# Patient Record
Sex: Female | Born: 1995 | Race: White | Hispanic: Yes | Marital: Married | Smoking: Former smoker
Health system: Southern US, Community
[De-identification: ages and names within clinical notes are randomized; demographics above are authoritative.]

## PROBLEM LIST (undated history)

## (undated) DIAGNOSIS — F32A Depression, unspecified: Secondary | ICD-10-CM

## (undated) DIAGNOSIS — N83209 Unspecified ovarian cyst, unspecified side: Secondary | ICD-10-CM

## (undated) DIAGNOSIS — F319 Bipolar disorder, unspecified: Secondary | ICD-10-CM

## (undated) DIAGNOSIS — I10 Essential (primary) hypertension: Secondary | ICD-10-CM

## (undated) DIAGNOSIS — J45909 Unspecified asthma, uncomplicated: Secondary | ICD-10-CM

## (undated) DIAGNOSIS — F419 Anxiety disorder, unspecified: Secondary | ICD-10-CM

## (undated) DIAGNOSIS — F1910 Other psychoactive substance abuse, uncomplicated: Secondary | ICD-10-CM

## (undated) DIAGNOSIS — K219 Gastro-esophageal reflux disease without esophagitis: Secondary | ICD-10-CM

## (undated) DIAGNOSIS — T7840XA Allergy, unspecified, initial encounter: Secondary | ICD-10-CM

## (undated) HISTORY — DX: Gastro-esophageal reflux disease without esophagitis: K21.9

## (undated) HISTORY — DX: Anxiety disorder, unspecified: F41.9

## (undated) HISTORY — DX: Other psychoactive substance abuse, uncomplicated: F19.10

## (undated) HISTORY — PX: TONSILLECTOMY: SUR1361

## (undated) HISTORY — DX: Allergy, unspecified, initial encounter: T78.40XA

## (undated) HISTORY — PX: TUBAL LIGATION: SHX77

## (undated) HISTORY — DX: Bipolar disorder, unspecified: F31.9

## (undated) HISTORY — DX: Unspecified ovarian cyst, unspecified side: N83.209

## (undated) HISTORY — DX: Depression, unspecified: F32.A

---
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
## 2017-01-03 ENCOUNTER — Emergency Department
Admission: EM | Admit: 2017-01-03 | Discharge: 2017-01-04 | Disposition: A | Discharge status: Home / Self Care | Payer: Medicaid Other | Attending: Emergency Medicine | Admitting: Emergency Medicine

## 2017-01-03 ENCOUNTER — Encounter: Payer: Self-pay | Admitting: Emergency Medicine

## 2017-01-03 DIAGNOSIS — F410 Panic disorder [episodic paroxysmal anxiety] without agoraphobia: Secondary | ICD-10-CM | POA: Diagnosis not present

## 2017-01-03 DIAGNOSIS — I10 Essential (primary) hypertension: Secondary | ICD-10-CM | POA: Insufficient documentation

## 2017-01-03 DIAGNOSIS — Z79899 Other long term (current) drug therapy: Secondary | ICD-10-CM | POA: Diagnosis not present

## 2017-01-03 DIAGNOSIS — F419 Anxiety disorder, unspecified: Secondary | ICD-10-CM | POA: Diagnosis present

## 2017-01-03 DIAGNOSIS — J45909 Unspecified asthma, uncomplicated: Secondary | ICD-10-CM | POA: Insufficient documentation

## 2017-01-03 DIAGNOSIS — Z87891 Personal history of nicotine dependence: Secondary | ICD-10-CM | POA: Insufficient documentation

## 2017-01-03 LAB — GLUCOSE, CAPILLARY: Glucose-Capillary: 79 mg/dL (ref 65–99)

## 2017-01-03 NOTE — ED Triage Notes (Signed)
Pt presents to ED 27 c/o anxiety attack while walking back from the fireworks in town; pt states "I didn't have seizures" as stated by family to EMS; per EMS, pt's VS were normal with sinus tachycardia; pt is awake, alert and oriented x4; pt denies any pain except for a headache.

## 2017-01-03 NOTE — ED Provider Notes (Signed)
[facility] Emergency Department Provider Note __   First MD Initiated Contact with Patient 01/03/17 2317     (approximate)  I have reviewed the triage vital signs and the nursing notes.   HISTORY  Chief Complaint Anxiety   HPI Selena Williamson is a 21 y.o. female below list of chronic medical conditions including anxiety presents to the emergency department after "an anxiety attack". Patient states that she was notified by her grandmother that she needed to come home because her infant needed her. Patient states while walking home which she states "[place] best [place] neighborhoods" she was being followed by unknown people which made her very anxious in addition patient states she was short of breath secondary to fact that she has asthma and it's hot outside". On arrival to her grandmother's house grandmother states that the patient was hyperventilating and came through the door on hands and knees. Grandmother states that she attempted to have the patient's lower respirations however she persisted to hyperventilate and then subsequently lost consciousness for approximately 15 minutes. Patient states on awakening she felt "like I was hit with a baseball bat". Patient states that she had a headache. Patient does recall hand feet tingling numbness and face before passing out.   Past Medical History:  Diagnosis Date  . Abdominal pain   . Asthma   . Hypertension     Patient Active Problem List   Diagnosis Date Noted  . Family history of Crohn's disease 01/13/2011  . Personal history of kidney stones 01/13/2011  . Generalized abdominal pain     Past Surgical History:  Procedure Laterality Date  . TONSILLECTOMY      Prior to Admission medications   Medication Sig Start Date End Date Taking? Authorizing Provider  ibuprofen (ADVIL,MOTRIN) 600 MG tablet Take 1 tablet (600 mg total) by mouth every 8 (eight) hours as needed. 08/15/16   Joni Reining,  PA-C  ibuprofen (ADVIL,MOTRIN) 600 MG tablet Take 1 tablet (600 mg total) by mouth every 8 (eight) hours as needed. 08/15/16   Joni Reining, PA-C  lamoTRIgine (LAMICTAL) 100 MG tablet Take 100 mg by mouth daily.  12/30/10   [provider]  loratadine (CLARITIN) 10 MG tablet Take 10 mg by mouth daily.   12/30/10   [provider]  Norgestim-Eth Charlott [place]ler Triphasic (ORTHO TRI-CYCLEN LO) 0.18/0.215/0.25 MG-25 MCG TABS Take by mouth.      [provider]  Norgestim-Eth Estrad Triphasic (ORTHO TRI-CYCLEN, 28,) 0.18/0.215/0.25 MG-35 MCG TABS Take 35 mcg by mouth daily.   12/30/10   [provider]  omeprazole (PRILOSEC) 40 MG capsule Take 40 mg by mouth daily.   12/30/10   [provider]  ondansetron (ZOFRAN ODT) 4 MG disintegrating tablet Take 1 tablet (4 mg total) by mouth every 8 (eight) hours as needed for nausea or vomiting. 11/21/16   Don Perking, [place], MD  sertraline (ZOLOFT) 50 MG tablet Take 50 mg by mouth daily.  12/30/10   [provider]  traMADol (ULTRAM) 50 MG tablet Take 1 tablet (50 mg total) by mouth every 6 (six) hours as needed for moderate pain. 08/15/16   Joni Reining, PA-C    Allergies Ciprofloxacin; Geodon [ziprasidone hcl]; and Morphine and related  Family History  Problem Relation Age of Onset  . Diabetes Sister   . Ulcers Maternal Aunt   . Inflammatory bowel disease Mother   . Urolithiasis Father   . Ulcers Maternal Grandfather  Social History Social History  Substance Use Topics  . Smoking status: Former Games developermoker  . Smokeless tobacco: Never Used  . Alcohol use No    Review of Systems Constitutional: No fever/chills Eyes: No visual changes. ENT: No sore throat. Cardiovascular: Denies chest pain. Respiratory: Denies shortness of breath. Gastrointestinal: No abdominal pain.  No nausea, no vomiting.  No diarrhea.  No constipation. Genitourinary: Negative for dysuria. Musculoskeletal: Negative for neck pain.   Negative for back pain. Integumentary: Negative for rash. Neurological: Negative for headaches, focal weakness or numbness. Psychiatric:Positive for panic attack   ____________________________________________   PHYSICAL EXAM:  VITAL SIGNS: ED Triage Vitals  Enc Vitals Group     BP 01/03/17 2233 111/89     Pulse Rate 01/03/17 2233 (!) 114     Resp 01/03/17 2233 13     Temp 01/03/17 2233 98.8 F (37.1 C)     Temp Source 01/03/17 2233 Oral     SpO2 01/03/17 2233 99 %     Weight 01/03/17 2237 86.2 kg (190 lb)     Height 01/03/17 2237 1.575 m (5\' 2" )     Head Circumference --      Peak Flow --      Pain Score 01/03/17 2232 7     Pain Loc --      Pain Edu? --      Excl. in GC? --     Constitutional: Alert and oriented. Well appearing and in no acute distress. Eyes: Conjunctivae are normal. PERRL. EOMI. Head: Atraumatic. Mouth/Throat: Mucous membranes are moist.  Oropharynx non-erythematous. Neck: No stridor.  Cardiovascular: Normal rate, regular rhythm. Good peripheral circulation. Grossly normal heart sounds. Respiratory: Normal respiratory effort.  No retractions. Lungs CTAB. Gastrointestinal: Soft and nontender. No distention.  Musculoskeletal: No lower extremity tenderness nor edema. No gross deformities of extremities. Neurologic:  Normal speech and language. No gross focal neurologic deficits are appreciated.  Skin:  Skin is warm, dry and intact. No rash noted. Psychiatric: Mood and affect are normal. Speech and behavior are normal.  ____________________________________________   LABS (all labs ordered are listed, but only abnormal results are displayed)  Labs Reviewed  GLUCOSE, CAPILLARY      Procedures   ____________________________________________   INITIAL IMPRESSION / ASSESSMENT AND PLAN / ED COURSE  Pertinent labs & imaging results that were available during my care of the patient were reviewed by me and considered in my medical decision making  (see chart for details).  Patient denies any symptoms of anxiety at this time. No focal neurological deficits such imaging not performed. History of physical exam consistent with panic attack with resultant hyperventilation and syncope.      ____________________________________________  FINAL CLINICAL IMPRESSION(S) / ED DIAGNOSES  Final diagnoses:  Panic attack     MEDICATIONS GIVEN DURING THIS VISIT:  Medications - No data to display   NEW OUTPATIENT MEDICATIONS STARTED DURING THIS VISIT:  New Prescriptions   No medications on file    Modified Medications   No medications on file    Discontinued Medications   No medications on file     Note:  This document was prepared using Dragon voice recognition software and may include unintentional dictation errors.    Darci CurrentBrown, [place], MD 01/04/17 0800

## 2017-01-04 NOTE — ED Notes (Signed)

## 2017-09-07 ENCOUNTER — Emergency Department: Payer: Medicaid Other

## 2017-09-07 ENCOUNTER — Emergency Department
Admission: EM | Admit: 2017-09-07 | Discharge: 2017-09-07 | Disposition: A | Discharge status: Home / Self Care | Payer: Medicaid Other | Attending: Emergency Medicine | Admitting: Emergency Medicine

## 2017-09-07 ENCOUNTER — Other Ambulatory Visit: Payer: Self-pay

## 2017-09-07 ENCOUNTER — Encounter: Payer: Self-pay | Admitting: Emergency Medicine

## 2017-09-07 DIAGNOSIS — Z3A01 Less than 8 weeks gestation of pregnancy: Secondary | ICD-10-CM | POA: Insufficient documentation

## 2017-09-07 DIAGNOSIS — J45909 Unspecified asthma, uncomplicated: Secondary | ICD-10-CM | POA: Insufficient documentation

## 2017-09-07 DIAGNOSIS — I10 Essential (primary) hypertension: Secondary | ICD-10-CM | POA: Insufficient documentation

## 2017-09-07 DIAGNOSIS — Z87891 Personal history of nicotine dependence: Secondary | ICD-10-CM | POA: Insufficient documentation

## 2017-09-07 DIAGNOSIS — O208 Other hemorrhage in early pregnancy: Secondary | ICD-10-CM | POA: Diagnosis not present

## 2017-09-07 DIAGNOSIS — Z79899 Other long term (current) drug therapy: Secondary | ICD-10-CM | POA: Insufficient documentation

## 2017-09-07 DIAGNOSIS — O209 Hemorrhage in early pregnancy, unspecified: Secondary | ICD-10-CM

## 2017-09-07 DIAGNOSIS — R1030 Lower abdominal pain, unspecified: Secondary | ICD-10-CM | POA: Diagnosis present

## 2017-09-07 DIAGNOSIS — R102 Pelvic and perineal pain: Secondary | ICD-10-CM | POA: Insufficient documentation

## 2017-09-07 LAB — URINALYSIS, COMPLETE (UACMP) WITH MICROSCOPIC
BILIRUBIN URINE: NEGATIVE
GLUCOSE, UA: NEGATIVE mg/dL
Hgb urine dipstick: NEGATIVE
KETONES UR: NEGATIVE mg/dL
NITRITE: NEGATIVE
PH: 7 (ref 5.0–8.0)
PROTEIN: NEGATIVE mg/dL
Specific Gravity, Urine: 1.014 (ref 1.005–1.030)

## 2017-09-07 LAB — POCT PREGNANCY, URINE: Preg Test, Ur: POSITIVE — AB

## 2017-09-07 LAB — HCG, QUANTITATIVE, PREGNANCY: hCG, Beta Chain, Quant, S: 1851 m[IU]/mL — ABNORMAL HIGH (ref <5)

## 2017-09-07 MED ORDER — ONDANSETRON 4 MG PO TBDP
4.0000 mg | ORAL_TABLET | Freq: Once | ORAL | Status: AC
  Administered 2017-09-07: 4 mg via ORAL

## 2017-09-07 MED ORDER — ONDANSETRON 4 MG PO TBDP
ORAL_TABLET | ORAL | Status: AC
  Filled 2017-09-07: qty 1

## 2017-09-07 NOTE — ED Notes (Signed)
Triage completed by this RN not RH.

## 2017-09-07 NOTE — ED Provider Notes (Signed)
[facility] Emergency Department Provider Note  ____________________________________________  Time seen: Approximately 10:21 AM  I have reviewed the triage vital signs and the nursing notes.   HISTORY  Chief Complaint Abdominal Pain    HPI Selena Williamson is a 22 y.o. female who was in her usual state of health when she went to sleep last night, and then woke up this morning with abdominal cramping and feeling like contractions in the suprapubic area. She also noted a small amount of maroon-colored vaginal discharge when she was wiping after using the bathroom this morning. Denies any ongoing vaginal bleeding. No radiating pain, no aggravating or alleviating factors, mild to moderate intensity.  She does report that she is 2 months pregnant by dates based on home pregnancy test. She was referred to [facility] for high risk maternal-fetal medicine due to gestational hypertension in the past. She has her first appointment in one week. No ultrasounds so far this pregnancy.     Past Medical History:  Diagnosis Date  . Abdominal pain   . Asthma   . Hypertension      Patient Active Problem List   Diagnosis Date Noted  . Family history of Crohn's disease 01/13/2011  . Personal history of kidney stones 01/13/2011  . Generalized abdominal pain      Past Surgical History:  Procedure Laterality Date  . TONSILLECTOMY       Prior to Admission medications   Medication Sig Start Date End Date Taking? Authorizing Provider  acetaminophen (TYLENOL) 325 MG tablet Take 650 mg by mouth every 6 (six) hours as needed.   Yes [provider]  ibuprofen (ADVIL,MOTRIN) 600 MG tablet Take 1 tablet (600 mg total) by mouth every 8 (eight) hours as needed. 08/15/16   Joni Reining, PA-C  ibuprofen (ADVIL,MOTRIN) 600 MG tablet Take 1 tablet (600 mg total) by mouth every 8 (eight) hours as needed. 08/15/16   Joni Reining, PA-C  lamoTRIgine (LAMICTAL) 100 MG tablet Take  100 mg by mouth daily.  12/30/10   [provider]  loratadine (CLARITIN) 10 MG tablet Take 10 mg by mouth daily.   12/30/10   [provider]  Norgestim-Eth Charlott Holler Triphasic (ORTHO TRI-CYCLEN LO) 0.18/0.215/0.25 MG-25 MCG TABS Take by mouth.      [provider]  Norgestim-Eth Estrad Triphasic (ORTHO TRI-CYCLEN, 28,) 0.18/0.215/0.25 MG-35 MCG TABS Take 35 mcg by mouth daily.   12/30/10   [provider]  omeprazole (PRILOSEC) 40 MG capsule Take 40 mg by mouth daily.   12/30/10   [provider]  ondansetron (ZOFRAN ODT) 4 MG disintegrating tablet Take 1 tablet (4 mg total) by mouth every 8 (eight) hours as needed for nausea or vomiting. 11/21/16   Don Perking, [place], MD  sertraline (ZOLOFT) 50 MG tablet Take 50 mg by mouth daily.  12/30/10   [provider]  traMADol (ULTRAM) 50 MG tablet Take 1 tablet (50 mg total) by mouth every 6 (six) hours as needed for moderate pain. 08/15/16   Joni Reining, PA-C     Allergies Ciprofloxacin; Geodon [ziprasidone hcl]; and Morphine and related   Family History  Problem Relation Age of Onset  . Diabetes Sister   . Ulcers Maternal Aunt   . Inflammatory bowel disease Mother   . Urolithiasis Father   . Ulcers Maternal Grandfather     Social History Social History   Tobacco Use  . Smoking status: Former Games developer  . Smokeless tobacco: Never Used  Substance Use  Topics  . Alcohol use: No  . Drug use: No    Review of Systems  Constitutional:   No fever or chills.  ENT:   No sore throat. No rhinorrhea. Cardiovascular:   No chest pain or syncope. Respiratory:   No dyspnea or cough. Gastrointestinal:   Positive cramping suprapubic pain without vomiting and diarrhea.  Musculoskeletal:   Negative for focal pain or swelling All other systems reviewed and are negative except as documented above in ROS and HPI.  ____________________________________________   PHYSICAL EXAM:  VITAL SIGNS: ED Triage  Vitals  Enc Vitals Group     BP 09/07/17 0837 126/82     Pulse Rate 09/07/17 0837 90     Resp 09/07/17 0837 18     Temp 09/07/17 0837 97.6 F (36.4 C)     Temp Source 09/07/17 0837 Oral     SpO2 09/07/17 0837 100 %     Weight 09/07/17 0842 195 lb (88.5 kg)     Height 09/07/17 0842 5' (1.524 m)     Head Circumference --      Peak Flow --      Pain Score 09/07/17 0841 8     Pain Loc --      Pain Edu? --      Excl. in GC? --     Vital signs reviewed, nursing assessments reviewed.   Constitutional:   Alert and oriented. Well appearing and in no distress. Eyes:   No scleral icterus.  EOMI. No nystagmus. No conjunctival pallor. PERRL. ENT   Head:   Normocephalic and atraumatic.   Nose:   No congestion/rhinnorhea.    Mouth/Throat:   MMM, no pharyngeal erythema. No peritonsillar mass.    Neck:   No meningismus. Full ROM. Hematological/Lymphatic/Immunilogical:   No cervical lymphadenopathy. Cardiovascular:   RRR. Symmetric bilateral radial and DP pulses.  No murmurs.  Respiratory:   Normal respiratory effort without tachypnea/retractions. Breath sounds are clear and equal bilaterally. No wheezes/rales/rhonchi. Gastrointestinal:   Soft with suprapubic and left lower quadrant tenderness. Non distended. There is no CVA tenderness.  No rebound, rigidity, or guarding. Genitourinary:   deferred Musculoskeletal:   Normal range of motion in all extremities. No joint effusions.  No lower extremity tenderness.  No edema. Neurologic:   Normal speech and language.  Motor grossly intact. No acute focal neurologic deficits are appreciated.  Skin:    Skin is warm, dry and intact. No rash noted.  No petechiae, purpura, or bullae.  ____________________________________________    LABS (pertinent positives/negatives) (all labs ordered are listed, but only abnormal results are displayed) Labs Reviewed  URINALYSIS, COMPLETE (UACMP) WITH MICROSCOPIC - Abnormal; Notable for the following  components:      Result Value   Color, Urine YELLOW (*)    APPearance CLOUDY (*)    Leukocytes, UA SMALL (*)    Bacteria, UA FEW (*)    Squamous Epithelial / LPF 0-5 (*)    All other components within normal limits  POCT PREGNANCY, URINE - Abnormal; Notable for the following components:   Preg Test, Ur POSITIVE (*)    All other components within normal limits  HCG, QUANTITATIVE, PREGNANCY  POC URINE PREG, ED   ____________________________________________   EKG    ____________________________________________    RADIOLOGY  [place] Ob Comp < 14 Wks  Result Date: 09/07/2017 CLINICAL DATA:  Pelvic pain and vaginal bleeding. Gestational age by LMP of 5 weeks 1 day. EXAM: OBSTETRIC <14 WK US AND TRANSVAGINAL OB [place]  TECHNIQUE: Both transabdominal and transvaginal ultrasound examinations were performed for complete evaluation of the gestation as well as the maternal uterus, adnexal regions, and pelvic cul-de-sac. Transvaginal technique was performed to assess early pregnancy. COMPARISON:  None. FINDINGS: Intrauterine gestational sac: Single Yolk sac:  Visualized. Embryo:  Not Visualized. MSD: 4 mm   5 w   1 d Subchorionic hemorrhage:  None visualized. Maternal uterus/adnexae: Retroverted uterus. Small right ovarian corpus luteum noted. Normal appearance left ovary. No mass or abnormal free fluid identified. IMPRESSION: Single intrauterine gestational sac measuring 5 weeks 1 day by mean sac diameter. This is concordant with LMP. Consider following serial b-hCG levels, with followup ultrasound to assess viability in 10 days. No significant maternal uterine or adnexal abnormality identified. Electronically Signed   By: Myles Rosenthal M.D.   On: 09/07/2017 11:39   US Ob Transvaginal  Result Date: 09/07/2017 CLINICAL DATA:  Pelvic pain and vaginal bleeding. Gestational age by LMP of 5 weeks 1 day. EXAM: OBSTETRIC <14 WK [place] AND TRANSVAGINAL OB US TECHNIQUE: Both transabdominal and transvaginal ultrasound  examinations were performed for complete evaluation of the gestation as well as the maternal uterus, adnexal regions, and pelvic cul-de-sac. Transvaginal technique was performed to assess early pregnancy. COMPARISON:  None. FINDINGS: Intrauterine gestational sac: Single Yolk sac:  Visualized. Embryo:  Not Visualized. MSD: 4 mm   5 w   1 d Subchorionic hemorrhage:  None visualized. Maternal uterus/adnexae: Retroverted uterus. Small right ovarian corpus luteum noted. Normal appearance left ovary. No mass or abnormal free fluid identified. IMPRESSION: Single intrauterine gestational sac measuring 5 weeks 1 day by mean sac diameter. This is concordant with LMP. Consider following serial b-hCG levels, with followup ultrasound to assess viability in 10 days. No significant maternal uterine or adnexal abnormality identified. Electronically Signed   By: Myles Rosenthal M.D.   On: 09/07/2017 11:39    ____________________________________________   PROCEDURES Procedures  ____________________________________________    CLINICAL IMPRESSION / ASSESSMENT AND PLAN / ED COURSE  Pertinent labs & imaging results that were available during my care of the patient were reviewed by me and considered in my medical decision making (see chart for details).     Clinical Course as of Sep 07 1149  Thu Sep 07, 2017  0910 Patient presents with abdominal cramping and vaginal bleeding this morning, approximately 2 months pregnant. Check serum hCG urinalysis. Review of electronic medical record shows blood type is A+ on 07/02/2016. Check ultrasound pelvis to evaluate for ectopic. Possible spontaneous abortion.  [PS]    Clinical Course User Index [PS] Sharman Cheek, MD     ----------------------------------------- 11:50 AM on 09/07/2017 -----------------------------------------  Vitals remain normal. Ultrasound shows live IUP, no cysts, no ectopic. UA negative for UTI. Follow up with primary care, follow-up with  obstetrics at [facility] as scheduled in one week. ____________________________________________   FINAL CLINICAL IMPRESSION(S) / ED DIAGNOSES    Final diagnoses:  Vaginal bleeding in pregnancy, first trimester     ED Discharge Orders    None      Portions of this note were generated with dragon dictation software. Dictation errors may occur despite best attempts at proofreading.    Sharman Cheek, MD 09/07/17 1151

## 2017-09-07 NOTE — ED Notes (Signed)
Patient instructed to not drink or eat until seen in ED.

## 2017-09-07 NOTE — ED Triage Notes (Signed)
Patient states she had a positive pregnancy test, [facility] X [facility]  Estimates she is approx. 2 mos pregnant.  Woke up this AM with abdominal cramping "like contractions" and maroon colored vaginal discharge.  G-3, LMP - 08/02/17.

## 2017-09-07 NOTE — Discharge Instructions (Signed)
Your ultrasound today is unremarkable, shows a intrauterine pregnancy without complication at this time. Follow-up with your obstetrics clinic as scheduled for continued prenatal care.  Results for orders placed or performed during the hospital encounter of 09/07/17  Urinalysis, Complete w Microscopic  Result Value Ref Range   Color, Urine YELLOW (A) YELLOW   APPearance CLOUDY (A) CLEAR   Specific Gravity, Urine 1.014 1.005 - 1.030   pH 7.0 5.0 - 8.0   Glucose, UA NEGATIVE NEGATIVE mg/dL   Hgb urine dipstick NEGATIVE NEGATIVE   Bilirubin Urine NEGATIVE NEGATIVE   Ketones, ur NEGATIVE NEGATIVE mg/dL   Protein, ur NEGATIVE NEGATIVE mg/dL   Nitrite NEGATIVE NEGATIVE   Leukocytes, UA SMALL (A) NEGATIVE   RBC / HPF 0-5 0 - 5 RBC/hpf   WBC, UA 0-5 0 - 5 WBC/hpf   Bacteria, UA FEW (A) NONE SEEN   Squamous Epithelial / LPF 0-5 (A) NONE SEEN   Mucus PRESENT   Pregnancy, urine POC  Result Value Ref Range   Preg Test, Ur POSITIVE (A) NEGATIVE   [place] Ob Comp < 14 Wks  Result Date: 09/07/2017 CLINICAL DATA:  Pelvic pain and vaginal bleeding. Gestational age by LMP of 5 weeks 1 day. EXAM: OBSTETRIC <14 WK US AND TRANSVAGINAL OB US TECHNIQUE: Both transabdominal and transvaginal ultrasound examinations were performed for complete evaluation of the gestation as well as the maternal uterus, adnexal regions, and pelvic cul-de-sac. Transvaginal technique was performed to assess early pregnancy. COMPARISON:  None. FINDINGS: Intrauterine gestational sac: Single Yolk sac:  Visualized. Embryo:  Not Visualized. MSD: 4 mm   5 w   1 d Subchorionic hemorrhage:  None visualized. Maternal uterus/adnexae: Retroverted uterus. Small right ovarian corpus luteum noted. Normal appearance left ovary. No mass or abnormal free fluid identified. IMPRESSION: Single intrauterine gestational sac measuring 5 weeks 1 day by mean sac diameter. This is concordant with LMP. Consider following serial b-hCG levels, with followup ultrasound  to assess viability in 10 days. No significant maternal uterine or adnexal abnormality identified. Electronically Signed   By: Myles RosenthalJohn  Stahl M.D.   On: 09/07/2017 11:39   [place] Ob Transvaginal  Result Date: 09/07/2017 CLINICAL DATA:  Pelvic pain and vaginal bleeding. Gestational age by LMP of 5 weeks 1 day. EXAM: OBSTETRIC <14 WK US AND TRANSVAGINAL OB US TECHNIQUE: Both transabdominal and transvaginal ultrasound examinations were performed for complete evaluation of the gestation as well as the maternal uterus, adnexal regions, and pelvic cul-de-sac. Transvaginal technique was performed to assess early pregnancy. COMPARISON:  None. FINDINGS: Intrauterine gestational sac: Single Yolk sac:  Visualized. Embryo:  Not Visualized. MSD: 4 mm   5 w   1 d Subchorionic hemorrhage:  None visualized. Maternal uterus/adnexae: Retroverted uterus. Small right ovarian corpus luteum noted. Normal appearance left ovary. No mass or abnormal free fluid identified. IMPRESSION: Single intrauterine gestational sac measuring 5 weeks 1 day by mean sac diameter. This is concordant with LMP. Consider following serial b-hCG levels, with followup ultrasound to assess viability in 10 days. No significant maternal uterine or adnexal abnormality identified. Electronically Signed   By: Myles RosenthalJohn  Stahl M.D.   On: 09/07/2017 11:39

## 2018-07-01 DIAGNOSIS — F3341 Major depressive disorder, recurrent, in partial remission: Secondary | ICD-10-CM | POA: Insufficient documentation

## 2018-07-01 DIAGNOSIS — R0781 Pleurodynia: Secondary | ICD-10-CM | POA: Insufficient documentation

## 2018-07-01 DIAGNOSIS — F339 Major depressive disorder, recurrent, unspecified: Secondary | ICD-10-CM | POA: Insufficient documentation

## 2019-01-08 IMAGING — US US OB TRANSVAGINAL
1 series · 14 of 28 positions shown · non-contrast
Comparison: None.

CLINICAL DATA: Pelvic pain and vaginal bleeding. Gestational age by
LMP of 5 weeks 1 day.

EXAM:
OBSTETRIC <14 WK US AND TRANSVAGINAL OB US
TECHNIQUE: Both transabdominal and transvaginal ultrasound examinations were
performed for complete evaluation of the gestation as well as the
maternal uterus, adnexal regions, and pelvic cul-de-sac.
Transvaginal technique was performed to assess early pregnancy.

[Series 1: us ob transvaginal · 0.26mm/px · 14 of 80 slices shown]
[im 3/80]
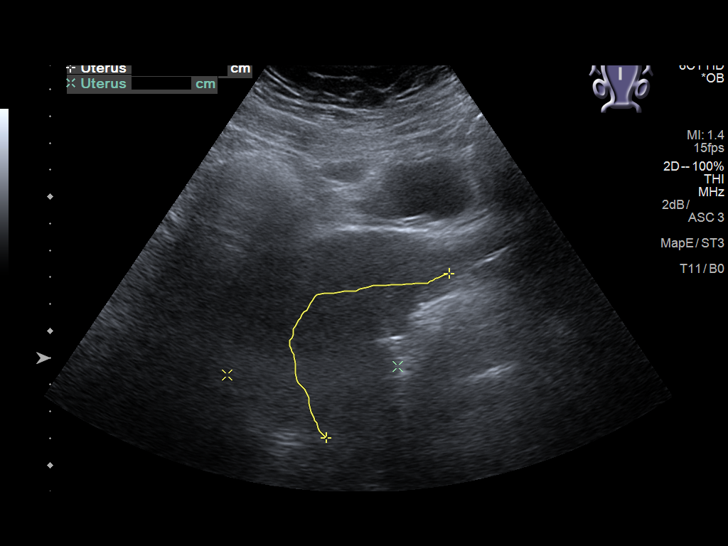
[im 9/80]
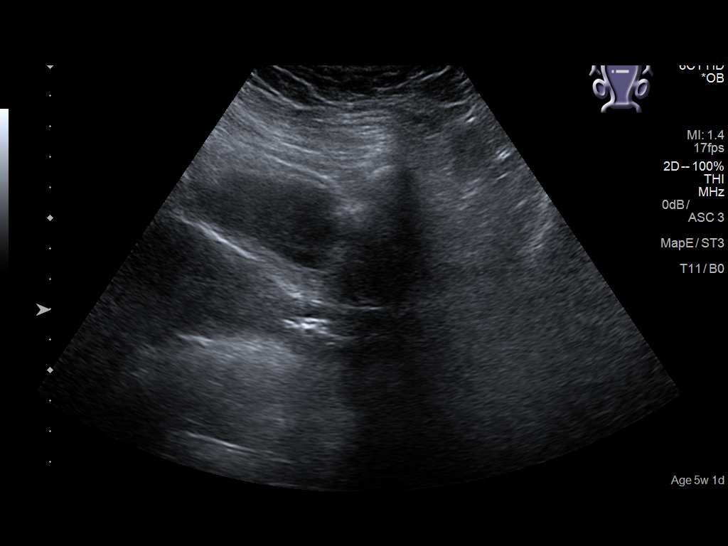
[im 15/80]
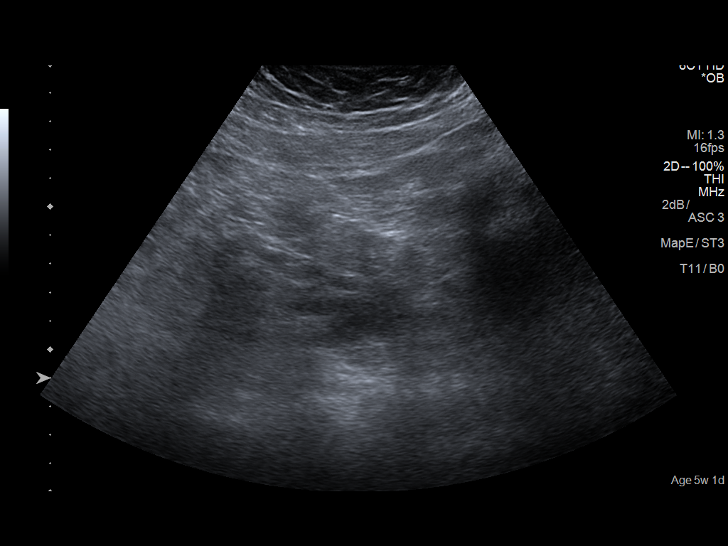
[im 21/80]
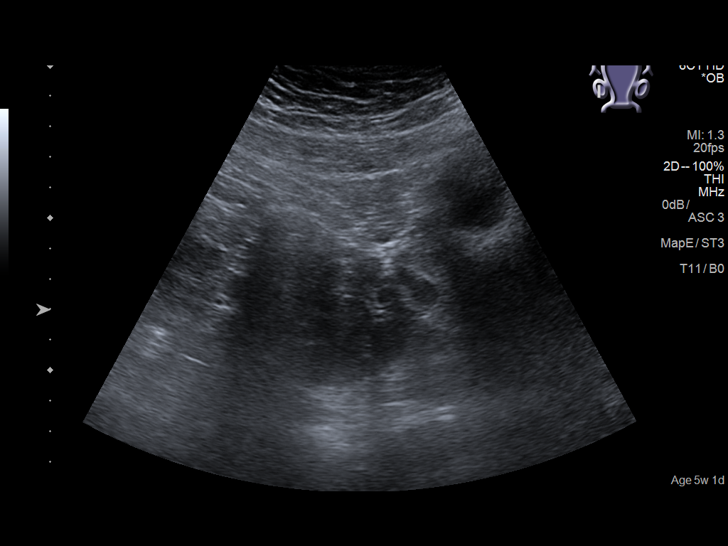
[im 27/80]
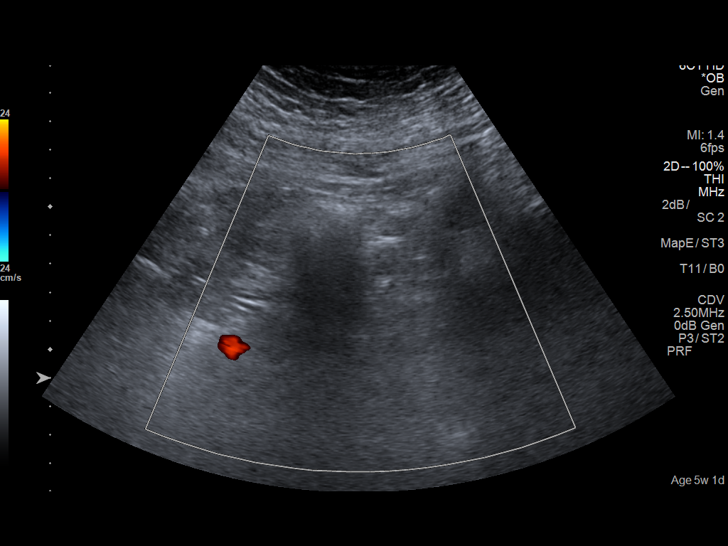
[im 33/80]
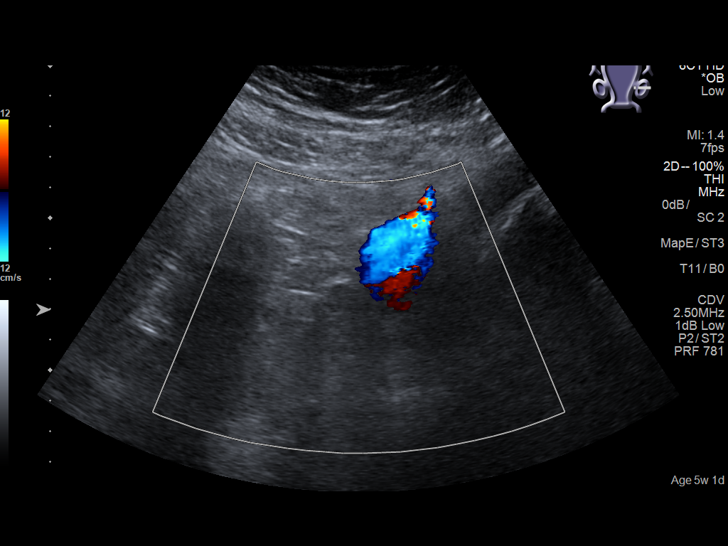
[im 39/80]
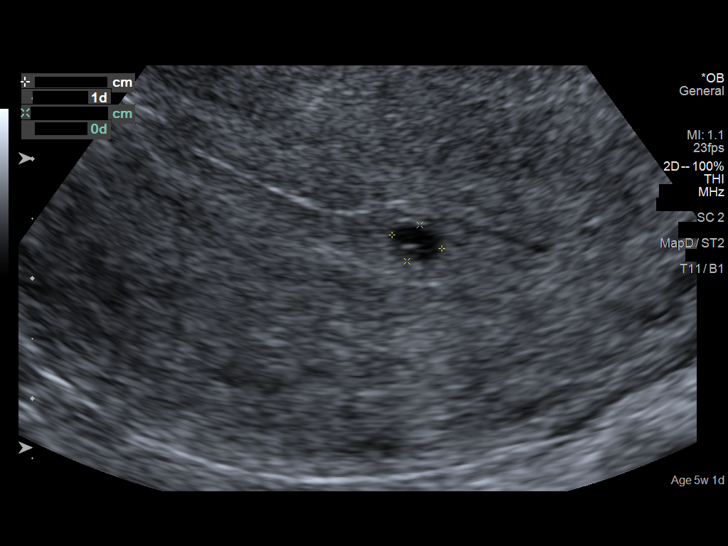
[im 44/80]
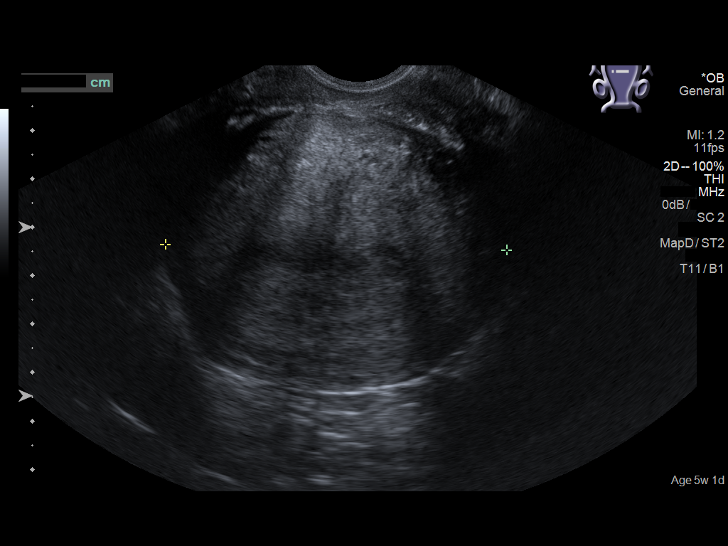
[im 50/80]
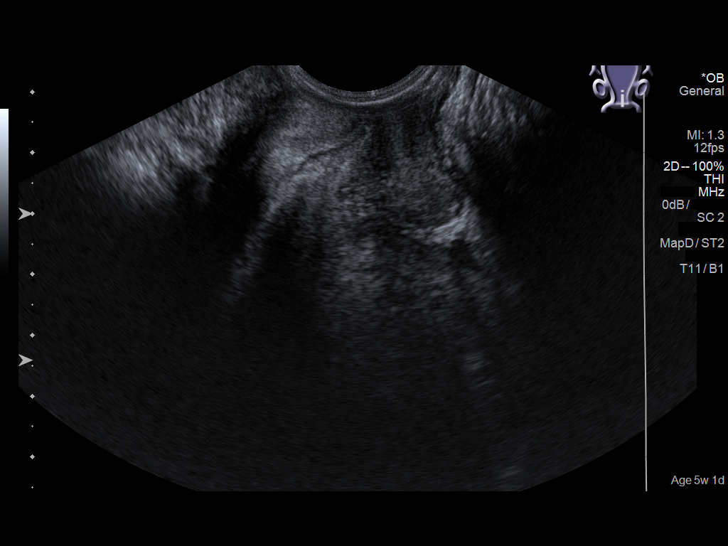
[im 56/80]
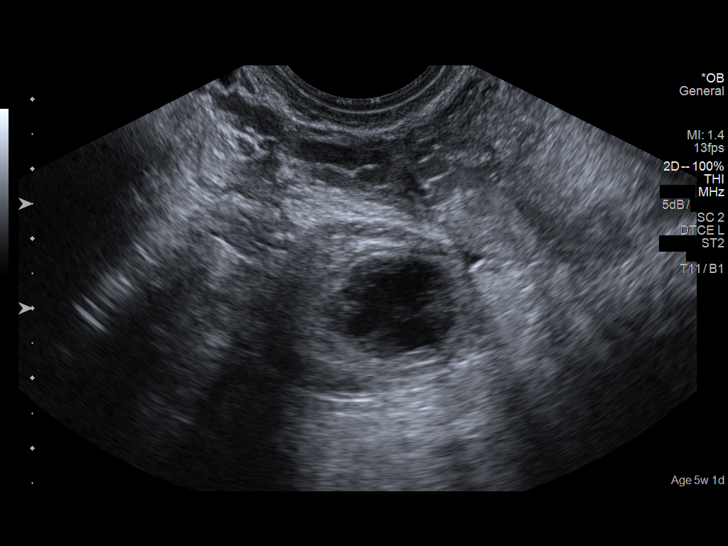
[im 62/80]
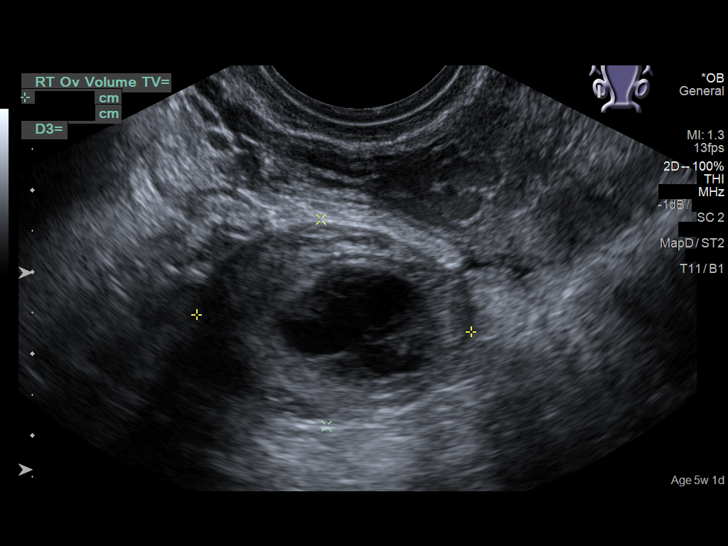
[im 68/80]
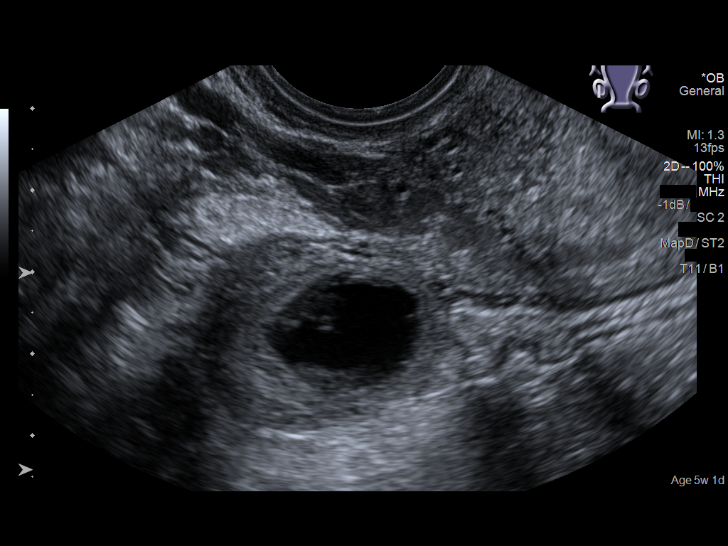
[im 74/80]
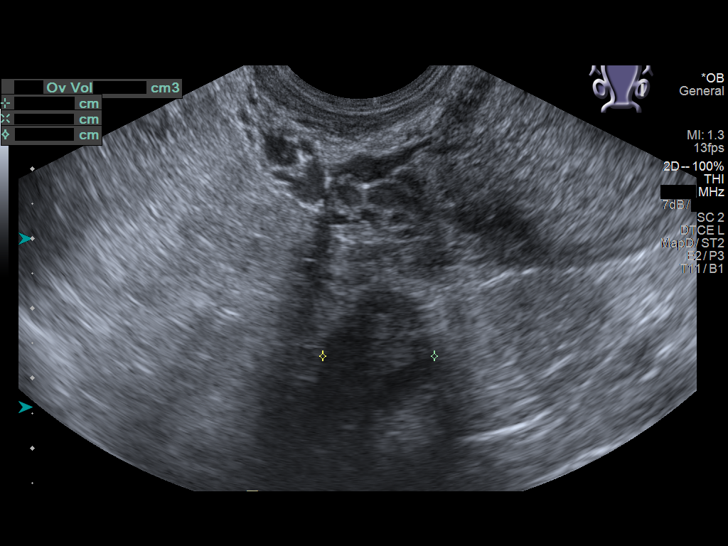
[im 80/80]
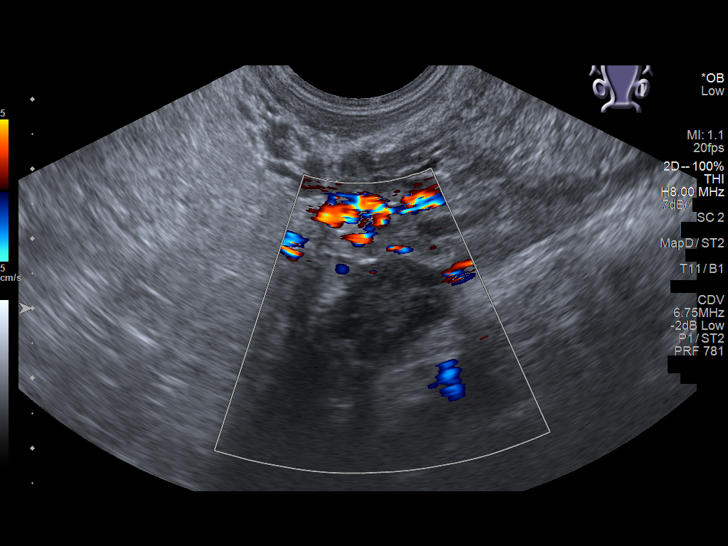

[14 of 28 positions shown; findings below may reference images not displayed]

FINDINGS: Intrauterine gestational sac: Single

Yolk sac:  Visualized.

Embryo:  Not Visualized.

MSD: 4 mm   5 w   1 d

Subchorionic hemorrhage:  None visualized.

Maternal uterus/adnexae: Retroverted uterus. Small right ovarian
corpus luteum noted. Normal appearance left ovary. No mass or
abnormal free fluid identified.
IMPRESSION: Single intrauterine gestational sac measuring 5 weeks 1 day by mean
sac diameter. This is concordant with LMP. Consider following serial
b-hCG levels, with followup ultrasound to assess viability in 10
days.

No significant maternal uterine or adnexal abnormality identified.

## 2020-03-27 ENCOUNTER — Ambulatory Visit: Payer: Self-pay | Admitting: Podiatry

## 2021-11-02 ENCOUNTER — Emergency Department: Payer: Medicaid Other

## 2021-11-02 ENCOUNTER — Other Ambulatory Visit: Payer: Self-pay

## 2021-11-02 ENCOUNTER — Encounter: Payer: Self-pay | Admitting: Emergency Medicine

## 2021-11-02 DIAGNOSIS — R0602 Shortness of breath: Secondary | ICD-10-CM | POA: Insufficient documentation

## 2021-11-02 DIAGNOSIS — Z5321 Procedure and treatment not carried out due to patient leaving prior to being seen by health care provider: Secondary | ICD-10-CM | POA: Insufficient documentation

## 2021-11-02 DIAGNOSIS — R079 Chest pain, unspecified: Secondary | ICD-10-CM | POA: Insufficient documentation

## 2021-11-02 LAB — CBC
HCT: 42.4 % (ref 36.0–46.0)
Hemoglobin: 13.9 g/dL (ref 12.0–15.0)
MCH: 27.5 pg (ref 26.0–34.0)
MCHC: 32.8 g/dL (ref 30.0–36.0)
MCV: 84 fL (ref 80.0–100.0)
Platelets: 471 10*3/uL — ABNORMAL HIGH (ref 150–400)
RBC: 5.05 MIL/uL (ref 3.87–5.11)
RDW: 14 % (ref 11.5–15.5)
WBC: 14.1 10*3/uL — ABNORMAL HIGH (ref 4.0–10.5)
nRBC: 0 % (ref 0.0–0.2)

## 2021-11-02 LAB — BASIC METABOLIC PANEL
Anion gap: 8 (ref 5–15)
BUN: 13 mg/dL (ref 6–20)
CO2: 21 mmol/L — ABNORMAL LOW (ref 22–32)
Calcium: 9.3 mg/dL (ref 8.9–10.3)
Chloride: 107 mmol/L (ref 98–111)
Creatinine, Ser: 0.59 mg/dL (ref 0.44–1.00)
GFR, Estimated: >60 mL/min (ref >60)
Glucose, Bld: 104 mg/dL — ABNORMAL HIGH (ref 70–99)
Potassium: 3.4 mmol/L — ABNORMAL LOW (ref 3.5–5.1)
Sodium: 136 mmol/L (ref 135–145)

## 2021-11-02 LAB — TROPONIN I (HIGH SENSITIVITY): Troponin I (High Sensitivity): <2 ng/L (ref <18)

## 2021-11-02 NOTE — ED Triage Notes (Signed)
Pt to ED via EMS from home c/o asthma attack tonight, sob and chest pain.  Pt states was carrying son down two flights of stairs, got to her car and felt very short of breath and knees gave out and pt fell.  Called her mom and got rescue inhaler and used 2 puffs.  States some relief after inhaler and at this time.  Pain is mid chest.  Pt A&Ox4, chest rise even and unlabored, skin WNL, speaking in complete and coherent sentences, in NAD at this time. ?

## 2021-11-02 NOTE — ED Triage Notes (Signed)
EMS brings pt in from home for c/o [facility] after argument with family member ?

## 2021-11-03 ENCOUNTER — Emergency Department
Admission: EM | Admit: 2021-11-03 | Discharge: 2021-11-03 | Disposition: A | Discharge status: Home / Self Care | Payer: Medicaid Other | Attending: Emergency Medicine | Admitting: Emergency Medicine

## 2021-11-03 NOTE — ED Notes (Signed)
No answer when called several times from lobby; no answer when phone # listed in chart called 

## 2021-11-03 NOTE — ED Notes (Signed)
No answer when called several times from lobby 

## 2021-11-15 DIAGNOSIS — F330 Major depressive disorder, recurrent, mild: Secondary | ICD-10-CM | POA: Diagnosis not present

## 2021-11-15 DIAGNOSIS — N943 Premenstrual tension syndrome: Secondary | ICD-10-CM | POA: Diagnosis not present

## 2021-11-15 DIAGNOSIS — N83201 Unspecified ovarian cyst, right side: Secondary | ICD-10-CM | POA: Diagnosis not present

## 2021-12-31 DIAGNOSIS — F331 Major depressive disorder, recurrent, moderate: Secondary | ICD-10-CM | POA: Diagnosis not present

## 2022-01-20 DIAGNOSIS — G43019 Migraine without aura, intractable, without status migrainosus: Secondary | ICD-10-CM | POA: Diagnosis not present

## 2022-01-20 DIAGNOSIS — G43719 Chronic migraine without aura, intractable, without status migrainosus: Secondary | ICD-10-CM | POA: Diagnosis not present

## 2022-10-17 ENCOUNTER — Ambulatory Visit: Payer: Medicaid Other | Admitting: Podiatry

## 2023-04-13 ENCOUNTER — Encounter: Payer: Self-pay | Admitting: Physician Assistant

## 2023-04-13 ENCOUNTER — Ambulatory Visit (INDEPENDENT_AMBULATORY_CARE_PROVIDER_SITE_OTHER): Payer: Medicaid Other | Admitting: Physician Assistant

## 2023-04-13 DIAGNOSIS — N926 Irregular menstruation, unspecified: Secondary | ICD-10-CM | POA: Diagnosis not present

## 2023-04-13 DIAGNOSIS — F339 Major depressive disorder, recurrent, unspecified: Secondary | ICD-10-CM

## 2023-04-13 DIAGNOSIS — I10 Essential (primary) hypertension: Secondary | ICD-10-CM

## 2023-04-13 DIAGNOSIS — Z7689 Persons encountering health services in other specified circumstances: Secondary | ICD-10-CM | POA: Diagnosis not present

## 2023-04-13 DIAGNOSIS — F419 Anxiety disorder, unspecified: Secondary | ICD-10-CM | POA: Diagnosis not present

## 2023-04-13 NOTE — Progress Notes (Signed)
New patient visit  Patient: Selena Williamson   DOB: 13-Jan-1996   27 y.o. Female  MRN: [id number] Visit Date: 04/13/2023  Today's healthcare provider: Debera Lat, PA-C   Chief Complaint  Patient presents with   Establish Care    Would like to discuss weight loss, was on medication that caused weight and would like help getting down in weight, cycle changes due to tubal removal, cycles are very irregular, would like to have hormonal testing, blood work for overall health   Medication Refill    Cymbalta and lisinopril    Subjective    Selena Williamson is a 27 y.o. female who presents today as a new patient to establish care.   Discussed the use of AI scribe software for clinical note transcription with the patient, who gave verbal consent to proceed.  History of Present Illness   The patient, with a history of tubal removal, presents with concerns about irregular menstrual cycles. He reports having had a stable menstrual cycle post-tubal removal, with periods lasting three days and occurring every 28 to 30 days. However, he experienced a significant disruption in his menstrual cycle following a severe reaction to medical Botox injections for tension headaches. This resulted in a six-month amenorrhea followed by a prolonged period lasting two and a half months. Since then, he has had two periods and is currently on his third, with each lasting about eight days and occurring every 22 days. He also mentions having cysts on both ovaries and previous discussions about potential issues with the lining of his uterus and cervix.  In addition to his menstrual concerns, the patient is also on a weight loss journey. He reports having difficulty with this due to his menstrual issues. He experiences daily nausea at 9:30 AM, which prevents him from eating until 12 PM. He has been on a weight loss journey with the help of an endocrinologist.         Past Medical History:  Diagnosis Date    Abdominal pain    Acid reflux    Allergy    Anxiety    Asthma    Bipolar 1 disorder (HCC)    Depression    Hypertension    Ovarian cyst    Substance abuse (HCC)    Past Surgical History:  Procedure Laterality Date   TONSILLECTOMY     TUBAL LIGATION     Family Status  Relation Name Status   Sister 50 yo Alive   Brother 8 yo Chemical engineer 3 out of 7 Alive   Sister 7 yo Armed forces training and education officer   Mother  (Not Specified)   Father  (Not Specified)   MGF  (Not Specified)  No partnership data on file   Family History  Problem Relation Age of Onset   Diabetes Sister    Ulcers Maternal Aunt    Inflammatory bowel disease Mother    Urolithiasis Father    Ulcers Maternal Grandfather    Social History   Socioeconomic History   Marital status: Married    Spouse name: Not on file   Number of children: Not on file   Years of education: Not on file   Highest education level: Not on file  Occupational History   Not on file  Tobacco Use   Smoking status: Former   Smokeless tobacco: Never  Vaping Use   Vaping status: Every Day  Substance and Sexual Activity   Alcohol use: No   Drug use: No   Sexual activity:  Yes  Other Topics Concern   Not on file  Social History Narrative   Completed 9th grade   Social Determinants of Health   Financial Resource Strain: High Risk (08/12/2022)   Received from [facility], [facility]   Overall Financial Resource Strain (CARDIA)    Difficulty of Paying Living Expenses: Hard  Food Insecurity: Food Insecurity Present (08/04/2022)   Received from [facility], [facility]   Hunger Vital Sign    Worried About Running Out of Food in the Last Year: Sometimes true    Ran Out of Food in the Last Year: Sometimes true  Transportation Needs: No Transportation Needs (08/04/2022)   Received from [facility], [facility]   [facility] - Transportation    Lack of Transportation (Medical): No    Lack of Transportation (Non-Medical): No  Physical  Activity: Not on file  Stress: Not on file  Social Connections: Not on file   Outpatient Medications Prior to Visit  Medication Sig   DULoxetine (CYMBALTA) 30 MG capsule Take 30 mg by mouth daily. Take 3 capsules by mouth nightly  (90 mg total)   lisinopril (ZESTRIL) 5 MG tablet Take 5 mg by mouth daily. Take one tablet by mouth daily   acetaminophen (TYLENOL) 325 MG tablet Take 650 mg by mouth every 6 (six) hours as needed. (Patient not taking: Reported on 04/13/2023)   albuterol (VENTOLIN HFA) 108 (90 Base) MCG/ACT inhaler Inhale into the lungs.   fluticasone (FLOVENT HFA) 110 MCG/ACT inhaler Inhale into the lungs.   ibuprofen (ADVIL,MOTRIN) 600 MG tablet Take 1 tablet (600 mg total) by mouth every 8 (eight) hours as needed. (Patient not taking: Reported on 04/13/2023)   ibuprofen (ADVIL,MOTRIN) 600 MG tablet Take 1 tablet (600 mg total) by mouth every 8 (eight) hours as needed. (Patient not taking: Reported on 04/13/2023)   lamoTRIgine (LAMICTAL) 100 MG tablet Take 100 mg by mouth daily.  (Patient not taking: Reported on 04/13/2023)   liraglutide (VICTOZA) 18 MG/3ML SOPN 0.6mg  daily for week 1, then 1.2mg  daily for week 2, then 1.8mg  daily for week 3, then 2.4mg  daily for week 4, and 3mg  daily after that (Patient not taking: Reported on 04/13/2023)   loratadine (CLARITIN) 10 MG tablet Take 10 mg by mouth daily.   (Patient not taking: Reported on 04/13/2023)   Norgestim-Eth Estrad Triphasic (ORTHO TRI-CYCLEN LO) 0.18/0.215/0.25 MG-25 MCG TABS Take by mouth.   (Patient not taking: Reported on 04/13/2023)   Norgestim-Eth Estrad Triphasic (ORTHO TRI-CYCLEN, 28,) 0.18/0.215/0.25 MG-35 MCG TABS Take 35 mcg by mouth daily.   (Patient not taking: Reported on 04/13/2023)   omeprazole (PRILOSEC) 40 MG capsule Take 40 mg by mouth daily.   (Patient not taking: Reported on 04/13/2023)   ondansetron (ZOFRAN ODT) 4 MG disintegrating tablet Take 1 tablet (4 mg total) by mouth every 8 (eight) hours as needed  for nausea or vomiting. (Patient not taking: Reported on 04/13/2023)   sertraline (ZOLOFT) 50 MG tablet Take 50 mg by mouth daily.  (Patient not taking: Reported on 04/13/2023)   traMADol (ULTRAM) 50 MG tablet Take 1 tablet (50 mg total) by mouth every 6 (six) hours as needed for moderate pain. (Patient not taking: Reported on 04/13/2023)   No facility-administered medications prior to visit.   Allergies  Allergen Reactions   Ciprofloxacin    Geodon [Ziprasidone Hcl]    Morphine And Codeine     Immunization History  Administered Date(s) Administered   DTP 11/05/1997,  01/07/1998, 02/25/1998, 12/16/1998, 09/28/2001   HPV Quadrivalent 11/12/2008, 01/14/2009, 05/19/2009   Hepatitis A, Adult 11/12/2008, 05/19/2009   Hepatitis B, ADULT 02-04[birth year]98, 11/05/1997, 02/25/1998   Influenza Split 06/05/2006, 03/11/2009   Influenza, Seasonal, Injecte, Preservative Fre 08/30/2007, 03/11/2009, 08/08/2011, 03/14/2012   Influenza,inj,Quad PF,6+ Mos 03/28/2013, 03/19/2014, 04/08/2016   Influenza-Unspecified 02/03/2017, 03/05/2018, 03/05/2019   MMR 12/02/97, 08/31/1998, 09/28/2001   OPV 11/05/1997, 01/07/1998, 12/16/1998, 09/28/2001   Pneumococcal Polysaccharide-23 10/31/2019   Tdap 08/30/2007, 04/08/2016   Varicella 12/16/1998, 11/12/2008    Health Maintenance  Topic Date Due   HIV Screening  Never done   Hepatitis C Screening  Never done   COVID-19 Vaccine (1 - 2023-24 season) Never done   Cervical Cancer Screening (Pap smear)  12/21/2025   DTaP/Tdap/Td (8 - Td or Tdap) 04/08/2026   INFLUENZA VACCINE  Completed   HPV VACCINES  Completed    Patient Care Team: Debera Lat, PA-C as PCP - General (Physician Assistant)  Review of Systems  All other systems reviewed and are negative.  Except see HPI       Objective    BP 122/81 (BP Location: Left Arm, Patient Position: Sitting, Cuff Size: Large)   Pulse 73   Ht 5' (1.524 m)   Wt 258 lb 6.4 oz (117.2 kg)   BMI 50.47 kg/m      Physical Exam Vitals reviewed.  Constitutional:      General: She is not in acute distress.    Appearance: Normal appearance. She is well-developed. She is obese. She is not diaphoretic.  HENT:     Head: Normocephalic and atraumatic.  Eyes:     General: No scleral icterus.    Conjunctiva/sclera: Conjunctivae normal.  Neck:     Thyroid: No thyromegaly.  Cardiovascular:     Rate and Rhythm: Normal rate and regular rhythm.     Pulses: Normal pulses.     Heart sounds: Normal heart sounds. No murmur heard. Pulmonary:     Effort: Pulmonary effort is normal. No respiratory distress.     Breath sounds: Normal breath sounds. No wheezing, rhonchi or rales.  Musculoskeletal:     Cervical back: Neck supple.     Right lower leg: No edema.     Left lower leg: No edema.  Lymphadenopathy:     Cervical: No cervical adenopathy.  Skin:    General: Skin is warm and dry.     Findings: No rash.  Neurological:     Mental Status: She is alert and oriented to person, place, and time. Mental status is at baseline.  Psychiatric:        Mood and Affect: Mood normal.        Behavior: Behavior normal.     Depression Screen    04/13/2023    2:38 PM  PHQ 2/9 Scores  PHQ - 2 Score 2  PHQ- 9 Score 7   Results for orders placed or performed in visit on 04/13/23  CBC with Differential/Platelet  Result Value Ref Range   WBC 8.5 3.4 - 10.8 x10E3/uL   RBC 4.83 3.77 - 5.28 x10E6/uL   Hemoglobin 13.1 11.1 - 15.9 g/dL   Hematocrit 16.1 09.6 - 46.6 %   MCV 84 79 - 97 fL   MCH 27.1 26.6 - 33.0 pg   MCHC 32.4 31.5 - 35.7 g/dL   RDW 04.5 40.9 - 81.1 %   Platelets 401 150 - 450 x10E3/uL   Neutrophils 55 Not Estab. %   Lymphs 35 Not Estab. %  Monocytes 8 Not Estab. %   Eos 1 Not Estab. %   Basos 1 Not Estab. %   Neutrophils Absolute 4.6 1.4 - 7.0 x10E3/uL   Lymphocytes Absolute 3.0 0.7 - 3.1 x10E3/uL   Monocytes Absolute 0.7 0.1 - 0.9 x10E3/uL   EOS (ABSOLUTE) 0.1 0.0 - 0.4 x10E3/uL    Basophils Absolute 0.1 0.0 - 0.2 x10E3/uL   Immature Granulocytes 0 Not Estab. %   Immature Grans (Abs) 0.0 0.0 - 0.1 x10E3/uL  Comprehensive metabolic panel  Result Value Ref Range   Glucose 77 70 - 99 mg/dL   BUN 10 6 - 20 mg/dL   Creatinine, Ser 4.01 0.57 - 1.00 mg/dL   eGFR 027 >25 DG/UYQ/0.34   BUN/Creatinine Ratio 14 9 - 23   Sodium 139 134 - 144 mmol/L   Potassium 4.4 3.5 - 5.2 mmol/L   Chloride 105 96 - 106 mmol/L   CO2 21 20 - 29 mmol/L   Calcium 9.4 8.7 - 10.2 mg/dL   Total Protein 6.9 6.0 - 8.5 g/dL   Albumin 4.3 4.0 - 5.0 g/dL   Globulin, Total 2.6 1.5 - 4.5 g/dL   Bilirubin Total 0.4 0.0 - 1.2 mg/dL   Alkaline Phosphatase 87 44 - 121 IU/L   AST 18 0 - 40 IU/L   ALT 18 0 - 32 IU/L  Hemoglobin A1c  Result Value Ref Range   Hgb A1c MFr Bld 5.2 4.8 - 5.6 %   Est. average glucose Bld gHb Est-mCnc 103 mg/dL  Lipid panel  Result Value Ref Range   Cholesterol, Total 196 100 - 199 mg/dL   Triglycerides 742 (H) 0 - 149 mg/dL   HDL 36 (L) >59 mg/dL   VLDL Cholesterol Cal 37 5 - 40 mg/dL   LDL Chol Calc (NIH) 563 (H) 0 - 99 mg/dL   Chol/HDL Ratio 5.4 (H) 0.0 - 4.4 ratio  TSH  Result Value Ref Range   TSH 0.910 0.450 - 4.500 uIU/mL    Assessment & Plan         Menstrual Irregularities History of tubal removal, cysts on ovaries, and irregular periods with heavy bleeding. Recent history of a 2.5 month long period. Current periods lasting about eight days and are twenty-two days apart. -Refer to OBGYN for further evaluation and management.  Obesity Weight Loss Patient is on a weight loss journey. -Refer to a nutritionist program if covered by insurance.  Depression Currently managed with Cymbalta. -Attempt to re-establish care with a mental health specialist in [place].  Hypertension Chronic and stable Managed with Lisinopril 5mg  nightly. -Continue current management. Advised low salt diet and regular exercise  General Health Maintenance -Order  comprehensive blood work to assess overall health. -Administer flu shot if patient is feeling well.     Encounter to establish care Welcomed to our clinic Reviewed past medical hx, social hx, family hx and surgical hx Pt advised to send all vaccination records or screening  The rest of the problems will be discussed at her next appointment/IBS.Marland Kitchen  Return in about 4 weeks (around 05/11/2023) for chronic disease f/u.   The patient was advised to call back or seek an in-person evaluation if the symptoms worsen or if the condition fails to improve as anticipated.  I discussed the assessment and treatment plan with the patient. The patient was provided an opportunity to ask questions and all were answered. The patient agreed with the plan and demonstrated an understanding of the instructions.  I, Debera Lat, PA-C  have reviewed all documentation for this visit. The documentation on  04/13/23  for the exam, diagnosis, procedures, and orders are all accurate and complete.  Debera Lat, [facility], MMS [facility] [phone] (phone) [phone] (fax)   [facility]

## 2023-04-14 LAB — CBC WITH DIFFERENTIAL/PLATELET
Basophils Absolute: 0.1 10*3/uL (ref 0.0–0.2)
Basos: 1 %
EOS (ABSOLUTE): 0.1 10*3/uL (ref 0.0–0.4)
Eos: 1 %
Hematocrit: 40.4 % (ref 34.0–46.6)
Hemoglobin: 13.1 g/dL (ref 11.1–15.9)
Immature Grans (Abs): 0 10*3/uL (ref 0.0–0.1)
Immature Granulocytes: 0 %
Lymphocytes Absolute: 3 10*3/uL (ref 0.7–3.1)
Lymphs: 35 %
MCH: 27.1 pg (ref 26.6–33.0)
MCHC: 32.4 g/dL (ref 31.5–35.7)
MCV: 84 fL (ref 79–97)
Monocytes Absolute: 0.7 10*3/uL (ref 0.1–0.9)
Monocytes: 8 %
Neutrophils Absolute: 4.6 10*3/uL (ref 1.4–7.0)
Neutrophils: 55 %
Platelets: 401 10*3/uL (ref 150–450)
RBC: 4.83 x10E6/uL (ref 3.77–5.28)
RDW: 14.9 % (ref 11.7–15.4)
WBC: 8.5 10*3/uL (ref 3.4–10.8)

## 2023-04-14 LAB — COMPREHENSIVE METABOLIC PANEL
ALT: 18 [IU]/L (ref 0–32)
AST: 18 [IU]/L (ref 0–40)
Albumin: 4.3 g/dL (ref 4.0–5.0)
Alkaline Phosphatase: 87 [IU]/L (ref 44–121)
BUN/Creatinine Ratio: 14 (ref 9–23)
BUN: 10 mg/dL (ref 6–20)
Bilirubin Total: 0.4 mg/dL (ref 0.0–1.2)
CO2: 21 mmol/L (ref 20–29)
Calcium: 9.4 mg/dL (ref 8.7–10.2)
Chloride: 105 mmol/L (ref 96–106)
Creatinine, Ser: 0.7 mg/dL (ref 0.57–1.00)
Globulin, Total: 2.6 g/dL (ref 1.5–4.5)
Glucose: 77 mg/dL (ref 70–99)
Potassium: 4.4 mmol/L (ref 3.5–5.2)
Sodium: 139 mmol/L (ref 134–144)
Total Protein: 6.9 g/dL (ref 6.0–8.5)
eGFR: 123 mL/min/{1.73_m2} (ref >59)

## 2023-04-14 LAB — LIPID PANEL
Chol/HDL Ratio: 5.4 {ratio} — ABNORMAL HIGH (ref 0.0–4.4)
Cholesterol, Total: 196 mg/dL (ref 100–199)
HDL: 36 mg/dL — ABNORMAL LOW (ref >39)
LDL Chol Calc (NIH): 123 mg/dL — ABNORMAL HIGH (ref 0–99)
Triglycerides: 211 mg/dL — ABNORMAL HIGH (ref 0–149)
VLDL Cholesterol Cal: 37 mg/dL (ref 5–40)

## 2023-04-14 LAB — TSH: TSH: 0.91 u[IU]/mL (ref 0.450–4.500)

## 2023-04-14 LAB — HEMOGLOBIN A1C
Est. average glucose Bld gHb Est-mCnc: 103 mg/dL
Hgb A1c MFr Bld: 5.2 % (ref 4.8–5.6)

## 2023-04-14 NOTE — Progress Notes (Incomplete)
New patient visit  Patient: Selena Williamson   DOB: [birth year]   27 y.o. Female  MRN: [id number] Visit Date: 04/13/2023  Today's healthcare provider: Debera Lat, PA-C   Chief Complaint  Patient presents with  . Establish Care    Would like to discuss weight loss, was on medication that caused weight and would like help getting down in weight, cycle changes due to tubal removal, cycles are very irregular, would like to have hormonal testing, blood work for [employer]  . Medication Refill    Cymbalta and lisinopril    Subjective    Selena Williamson is a 27 y.o. female who presents today as a new patient to establish care.   Discussed the use of AI scribe software for clinical note transcription with the patient, who gave verbal consent to proceed.  History of Present Illness   The patient, with a history of tubal removal, presents with concerns about irregular menstrual cycles. He reports having had a stable menstrual cycle post-tubal removal, with periods lasting three days and occurring every 28 to 30 days. However, he experienced a significant disruption in his menstrual cycle following a severe reaction to medical Botox injections for tension headaches. This resulted in a six-month amenorrhea followed by a prolonged period lasting two and a half months. Since then, he has had two periods and is currently on his third, with each lasting about eight days and occurring every 22 days. He also mentions having cysts on both ovaries and previous discussions about potential issues with the lining of his uterus and cervix.  In addition to his menstrual concerns, the patient is also on a weight loss journey. He reports having difficulty with this due to his menstrual issues. He experiences daily nausea at 9:30 AM, which prevents him from eating until 12 PM. He has been on a weight loss journey with the help of an endocrinologist.         Past Medical History:  Diagnosis Date  .  Abdominal pain   . Acid reflux   . Allergy   . Anxiety   . Asthma   . Bipolar 1 disorder (HCC)   . Depression   . Hypertension   . Ovarian cyst   . Substance abuse [facility])    Past Surgical History:  Procedure Laterality Date  . TONSILLECTOMY    . TUBAL LIGATION     Family Status  Relation Name Status  . Sister 29 yo Armed forces training and education officer  . Brother 67 yo Armed forces training and education officer  . Mat Aunt 3 out of 7 Alive  . Sister 28 yo Armed forces training and education officer  . Mother  (Not Specified)  . Father  (Not Specified)  . MGF  (Not Specified)  No partnership data on file   Family History  Problem Relation Age of Onset  . Diabetes Sister   . Ulcers Maternal Aunt   . Inflammatory bowel disease Mother   . Urolithiasis Father   . Ulcers Maternal Grandfather    Social History   Socioeconomic History  . Marital status: Married    Spouse name: Not on file  . Number of children: Not on file  . Years of education: Not on file  . Highest education level: Not on file  Occupational History  . Not on file  Tobacco Use  . Smoking status: Former  . Smokeless tobacco: Never  Vaping Use  . Vaping status: Every Day  Substance and Sexual Activity  . Alcohol use: No  . Drug use: No  . Sexual activity:  Yes  Other Topics Concern  . Not on file  Social History Narrative   Completed 9th grade   Social Determinants of Health   Financial Resource Strain: High Risk (08/12/2022)   Received from [facility], [facility]   Overall Financial Resource Strain (CARDIA)   . Difficulty of Paying Living Expenses: Hard  Food Insecurity: Food Insecurity Present (08/04/2022)   Received from [facility], [facility]   Hunger Vital Sign   . Worried About Programme researcher, broadcasting/film/video in the Last Year: Sometimes true   . Ran Out of Food in the Last Year: Sometimes true  Transportation Needs: No Transportation Needs (08/04/2022)   Received from [facility], [facility]   [facility] - Transportation   . Lack of Transportation (Medical): No   . Lack of  Transportation (Non-Medical): No  Physical Activity: Not on file  Stress: Not on file  Social Connections: Not on file   Outpatient Medications Prior to Visit  Medication Sig  . DULoxetine (CYMBALTA) 30 MG capsule Take 30 mg by mouth daily. Take 3 capsules by mouth nightly  (90 mg total)  . lisinopril (ZESTRIL) 5 MG tablet Take 5 mg by mouth daily. Take one tablet by mouth daily  . acetaminophen (TYLENOL) 325 MG tablet Take 650 mg by mouth every 6 (six) hours as needed. (Patient not taking: Reported on 04/13/2023)  . albuterol (VENTOLIN HFA) 108 (90 Base) MCG/ACT inhaler Inhale into the lungs.  . fluticasone (FLOVENT HFA) 110 MCG/ACT inhaler Inhale into the lungs.  Marland Kitchen ibuprofen (ADVIL,MOTRIN) 600 MG tablet Take 1 tablet (600 mg total) by mouth every 8 (eight) hours as needed. (Patient not taking: Reported on 04/13/2023)  . ibuprofen (ADVIL,MOTRIN) 600 MG tablet Take 1 tablet (600 mg total) by mouth every 8 (eight) hours as needed. (Patient not taking: Reported on 04/13/2023)  . lamoTRIgine (LAMICTAL) 100 MG tablet Take 100 mg by mouth daily.  (Patient not taking: Reported on 04/13/2023)  . liraglutide (VICTOZA) 18 MG/3ML SOPN 0.6mg  daily for week 1, then 1.2mg  daily for week 2, then 1.8mg  daily for week 3, then 2.4mg  daily for week 4, and 3mg  daily after that (Patient not taking: Reported on 04/13/2023)  . loratadine (CLARITIN) 10 MG tablet Take 10 mg by mouth daily.   (Patient not taking: Reported on 04/13/2023)  . Norgestim-Eth Estrad Triphasic (ORTHO TRI-CYCLEN LO) 0.18/0.215/0.25 MG-25 MCG TABS Take by mouth.   (Patient not taking: Reported on 04/13/2023)  . Norgestim-Eth Estrad Triphasic (ORTHO TRI-CYCLEN, 28,) 0.18/0.215/0.25 MG-35 MCG TABS Take 35 mcg by mouth daily.   (Patient not taking: Reported on 04/13/2023)  . omeprazole (PRILOSEC) 40 MG capsule Take 40 mg by mouth daily.   (Patient not taking: Reported on 04/13/2023)  . ondansetron (ZOFRAN ODT) 4 MG disintegrating tablet Take 1  tablet (4 mg total) by mouth every 8 (eight) hours as needed for nausea or vomiting. (Patient not taking: Reported on 04/13/2023)  . sertraline (ZOLOFT) 50 MG tablet Take 50 mg by mouth daily.  (Patient not taking: Reported on 04/13/2023)  . traMADol (ULTRAM) 50 MG tablet Take 1 tablet (50 mg total) by mouth every 6 (six) hours as needed for moderate pain. (Patient not taking: Reported on 04/13/2023)   No facility-administered medications prior to visit.   Allergies  Allergen Reactions  . Ciprofloxacin   . Geodon [Ziprasidone Hcl]   . Morphine And Codeine     Immunization History  Administered Date(s) Administered  . DTP 11/05/1997,  01/07/1998, 02/25/1998, 12/16/1998, 09/28/2001  . HPV Quadrivalent 11/12/2008, 01/14/2009, 05/19/2009  . Hepatitis A, Adult 11/12/2008, 05/19/2009  . Hepatitis B, ADULT Nov [birth year], 1997, 11/05/1997, 02/25/1998  . Influenza Split 06/05/2006, 03/11/2009  . Influenza, Seasonal, Injecte, Preservative Fre 08/30/2007, 03/11/2009, 08/08/2011, 03/14/2012  . Influenza,inj,Quad PF,6+ Mos 03/28/2013, 03/19/2014, 04/08/2016  . Influenza-Unspecified 02/03/2017, 03/05/2018, 03/05/2019  . MMR 07-26-97, 08/31/1998, 09/28/2001  . OPV 11/05/1997, 01/07/1998, 12/16/1998, 09/28/2001  . Pneumococcal Polysaccharide-23 10/31/2019  . Tdap 08/30/2007, 04/08/2016  . Varicella 12/16/1998, 11/12/2008    Health Maintenance  Topic Date Due  . HIV Screening  Never done  . Hepatitis C Screening  Never done  . COVID-19 Vaccine (1 - 2023-24 season) Never done  . Cervical Cancer Screening (Pap smear)  12/21/2025  . DTaP/Tdap/Td (8 - Td or Tdap) 04/08/2026  . INFLUENZA VACCINE  Completed  . HPV VACCINES  Completed    Patient Care Team: Debera Lat, PA-C as PCP - General (Physician Assistant)  Review of Systems Except see HPI   {Insert previous labs (optional):23779} {See past labs  Heme  Chem  Endocrine  Serology  Results Review (optional):1}   Objective    BP 122/81  (BP Location: Left Arm, Patient Position: Sitting, Cuff Size: Large)   Pulse 73   Ht 5' (1.524 m)   Wt 258 lb 6.4 oz (117.2 kg)   BMI 50.47 kg/m  {Insert last BP/Wt (optional):23777}{See vitals history (optional):1}   Physical Exam  Depression Screen    04/13/2023    2:38 PM  PHQ 2/9 Scores  PHQ - 2 Score 2  PHQ- 9 Score 7   Results for orders placed or performed in visit on 04/13/23  CBC with Differential/Platelet  Result Value Ref Range   WBC 8.5 3.4 - 10.8 x10E3/uL   RBC 4.83 3.77 - 5.28 x10E6/uL   Hemoglobin 13.1 11.1 - 15.9 g/dL   Hematocrit 84.1 32.4 - 46.6 %   MCV 84 79 - 97 fL   MCH 27.1 26.6 - 33.0 pg   MCHC 32.4 31.5 - 35.7 g/dL   RDW 40.1 02.7 - 25.3 %   Platelets 401 150 - 450 x10E3/uL   Neutrophils 55 Not Estab. %   Lymphs 35 Not Estab. %   Monocytes 8 Not Estab. %   Eos 1 Not Estab. %   Basos 1 Not Estab. %   Neutrophils Absolute 4.6 1.4 - 7.0 x10E3/uL   Lymphocytes Absolute 3.0 0.7 - 3.1 x10E3/uL   Monocytes Absolute 0.7 0.1 - 0.9 x10E3/uL   EOS (ABSOLUTE) 0.1 0.0 - 0.4 x10E3/uL   Basophils Absolute 0.1 0.0 - 0.2 x10E3/uL   Immature Granulocytes 0 Not Estab. %   Immature Grans (Abs) 0.0 0.0 - 0.1 x10E3/uL  Comprehensive metabolic panel  Result Value Ref Range   Glucose 77 70 - 99 mg/dL   BUN 10 6 - 20 mg/dL   Creatinine, Ser 6.64 0.57 - 1.00 mg/dL   eGFR 403 >47 QQ/VZD/6.38   BUN/Creatinine Ratio 14 9 - 23   Sodium 139 134 - 144 mmol/L   Potassium 4.4 3.5 - 5.2 mmol/L   Chloride 105 96 - 106 mmol/L   CO2 21 20 - 29 mmol/L   Calcium 9.4 8.7 - 10.2 mg/dL   Total Protein 6.9 6.0 - 8.5 g/dL   Albumin 4.3 4.0 - 5.0 g/dL   Globulin, Total 2.6 1.5 - 4.5 g/dL   Bilirubin Total 0.4 0.0 - 1.2 mg/dL   Alkaline Phosphatase 87 44 - 121 IU/L   AST  18 0 - 40 IU/L   ALT 18 0 - 32 IU/L  Hemoglobin A1c  Result Value Ref Range   Hgb A1c MFr Bld 5.2 4.8 - 5.6 %   Est. average glucose Bld gHb Est-mCnc 103 mg/dL  Lipid panel  Result Value Ref Range    Cholesterol, Total 196 100 - 199 mg/dL   Triglycerides 102 (H) 0 - 149 mg/dL   HDL 36 (L) >72 mg/dL   VLDL Cholesterol Cal 37 5 - 40 mg/dL   LDL Chol Calc (NIH) 536 (H) 0 - 99 mg/dL   Chol/HDL Ratio 5.4 (H) 0.0 - 4.4 ratio  TSH  Result Value Ref Range   TSH 0.910 0.450 - 4.500 uIU/mL    Assessment & Plan         Menstrual Irregularities History of tubal removal, cysts on ovaries, and irregular periods with heavy bleeding. Recent history of a 2.5 month long period. Current periods lasting about eight days and are twenty-two days apart. -Refer to OBGYN for further evaluation and management.  Obesity Weight Loss Patient is on a weight loss journey. -Refer to a nutritionist program if covered by insurance.  Depression Currently managed with Cymbalta. -Attempt to re-establish care with a mental health specialist in [place].  Hypertension Managed with Lisinopril 5mg  nightly. -Continue current management.  General Health Maintenance -Order comprehensive blood work to assess overall health. -Administer flu shot if patient is feeling well.     Encounter to establish care Welcomed to our clinic Reviewed past medical hx, social hx, family hx and surgical hx Pt advised to send all vaccination records or screening   Return in about 4 weeks (around 05/11/2023) for chronic disease f/u.   The patient was advised to call back or seek an in-person evaluation if the symptoms worsen or if the condition fails to improve as anticipated.  I discussed the assessment and treatment plan with the patient. The patient was provided an opportunity to ask questions and all were answered. The patient agreed with the plan and demonstrated an understanding of the instructions.  I, Debera Lat, PA-C have reviewed all documentation for this visit. The documentation on  04/13/23  for the exam, diagnosis, procedures, and orders are all accurate and complete.  Debera Lat, [facility], MMS [facility] [phone] (phone) [phone] (fax)   [facility]

## 2023-04-15 DIAGNOSIS — I10 Essential (primary) hypertension: Secondary | ICD-10-CM | POA: Insufficient documentation

## 2023-04-15 DIAGNOSIS — F419 Anxiety disorder, unspecified: Secondary | ICD-10-CM | POA: Insufficient documentation

## 2023-04-15 DIAGNOSIS — N926 Irregular menstruation, unspecified: Secondary | ICD-10-CM | POA: Insufficient documentation

## 2023-04-19 ENCOUNTER — Encounter: Payer: Self-pay | Admitting: Physician Assistant

## 2023-04-28 ENCOUNTER — Other Ambulatory Visit: Payer: Self-pay | Admitting: Medical Genetics

## 2023-04-28 DIAGNOSIS — Z006 Encounter for examination for normal comparison and control in clinical research program: Secondary | ICD-10-CM

## 2023-05-04 ENCOUNTER — Encounter: Payer: Self-pay | Admitting: Obstetrics and Gynecology

## 2023-05-04 ENCOUNTER — Ambulatory Visit (INDEPENDENT_AMBULATORY_CARE_PROVIDER_SITE_OTHER): Payer: Medicaid Other | Admitting: Obstetrics and Gynecology

## 2023-05-04 VITALS — BP 128/70 | HR 85 | Ht 63.0 in | Wt 253.6 lb

## 2023-05-04 DIAGNOSIS — N926 Irregular menstruation, unspecified: Secondary | ICD-10-CM

## 2023-05-04 DIAGNOSIS — E66813 Obesity, class 3: Secondary | ICD-10-CM | POA: Diagnosis not present

## 2023-05-04 DIAGNOSIS — N939 Abnormal uterine and vaginal bleeding, unspecified: Secondary | ICD-10-CM | POA: Insufficient documentation
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
## 2023-05-04 NOTE — Assessment & Plan Note (Addendum)
Reports seeing a nutritionist and losing 30lb over last 2 months. Congratulated patient on weight loss, however patient adamant that weight is not a factor in cycle irregularity. Discussed that while menstrual irregularities can occur at normal weight, working toward a normal weight can help with cycle regularity.  Discussed that with 30lb weight loss (>10% of starting weight), she may start to see improvement in her cycle. As patient wants to avoid hormonal therapy, we could follow-up in 6 months. Encouraged ongoing healthy diet and exercise.

## 2023-05-04 NOTE — Assessment & Plan Note (Addendum)
Clinical presentation c/w anovulatory bleeding, reports longstanding history Managed with hormonal therapies, however had severe mood changes with panic attacks- so declines hormonal therapies. Chart review also notable for migraine with aura (GYN note 12/26/22- care everywhere) W/u in 2024 normal CBC, TSH, PRL, FSH, EMB benign, normal TVUS (see care everywhere) PAP ASCUS, HPV neg Recommend labs for PCOS- test and 17 OH-P, ordered future. Discussed initial management including hormonal regulation with cyclical therapies and weight loss. Patient had to leave during visit due to emergency with her child RTO for ongoing discussion, ideally 3-6 months.

## 2023-05-11 ENCOUNTER — Other Ambulatory Visit
Admission: RE | Admit: 2023-05-11 | Discharge: 2023-05-11 | Disposition: A | Discharge status: Home / Self Care | Payer: Medicaid Other | Source: Ambulatory Visit | Attending: Medical Genetics | Admitting: Medical Genetics

## 2023-05-11 DIAGNOSIS — Z006 Encounter for examination for normal comparison and control in clinical research program: Secondary | ICD-10-CM | POA: Insufficient documentation

## 2023-05-23 LAB — HELIX MOLECULAR SCREEN: Genetic Analysis Overall Interpretation: NEGATIVE

## 2023-05-23 LAB — GENECONNECT MOLECULAR SCREEN

## 2023-06-13 ENCOUNTER — Encounter (HOSPITAL_COMMUNITY): Payer: Self-pay
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
## 2023-06-14 ENCOUNTER — Ambulatory Visit (HOSPITAL_COMMUNITY): Payer: Medicaid Other | Admitting: Psychiatry

## 2023-07-04 ENCOUNTER — Encounter (HOSPITAL_COMMUNITY): Payer: Self-pay

## 2023-07-06 ENCOUNTER — Ambulatory Visit (HOSPITAL_COMMUNITY): Payer: Medicaid Other | Admitting: Licensed Clinical Social Worker

## 2023-09-11 ENCOUNTER — Ambulatory Visit: Payer: Self-pay | Admitting: Physician Assistant

## 2023-09-11 NOTE — Progress Notes (Deleted)
 Established patient visit  Patient: Selena Williamson   DOB: 1996-06-27   28 y.o. Female  MRN: [id number] Visit Date: 09/11/2023  Today's healthcare provider: Debera Lat, PA-C   No chief complaint on file.  Subjective       Discussed the use of AI scribe software for clinical note transcription with the patient, who gave verbal consent to proceed.  History of Present Illness         Except elevated cholesterol and triglycerides. Advised over the counter omega 3 supplements/fish oil daily, low cholesterol diet and regular exercise. Let us discuss the labs in details during your next appointment        04/13/2023    2:38 PM  Depression screen PHQ 2/9  Decreased Interest 1  Down, Depressed, Hopeless 1  PHQ - 2 Score 2  Altered sleeping 0  Tired, decreased energy 2  Change in appetite 0  Feeling bad or failure about yourself  1  Trouble concentrating 2  Moving slowly or fidgety/restless 0  Suicidal thoughts 0  PHQ-9 Score 7      04/13/2023    2:38 PM  GAD 7 : Generalized Anxiety Score  Nervous, Anxious, on Edge 2  Control/stop worrying 0  Worry too much - different things 2  Trouble relaxing 2  Restless 3  Easily annoyed or irritable 3  Afraid - awful might happen 0  Total GAD 7 Score 12    Medications: Outpatient Medications Prior to Visit  Medication Sig   albuterol (VENTOLIN HFA) 108 (90 Base) MCG/ACT inhaler Inhale into the lungs.   DULoxetine (CYMBALTA) 30 MG capsule Take 30 mg by mouth daily. Take 3 capsules by mouth nightly  (90 mg total)   fluticasone (FLOVENT HFA) 110 MCG/ACT inhaler Inhale into the lungs.   lisinopril (ZESTRIL) 5 MG tablet Take 5 mg by mouth daily. Take one tablet by mouth daily   No facility-administered medications prior to visit.    Review of Systems  All other systems reviewed and are negative.  All negative Except see HPI   {Insert previous labs (optional):23779} {See past labs  Heme  Chem  Endocrine  Serology   Results Review (optional):1}   Objective    There were no vitals taken for this visit. {Insert last BP/Wt (optional):23777}{See vitals history (optional):1}   Physical Exam Vitals reviewed.  Constitutional:      General: She is not in acute distress.    Appearance: Normal appearance. She is well-developed. She is not diaphoretic.  HENT:     Head: Normocephalic and atraumatic.  Eyes:     General: No scleral icterus.    Conjunctiva/sclera: Conjunctivae normal.  Neck:     Thyroid: No thyromegaly.  Cardiovascular:     Rate and Rhythm: Normal rate and regular rhythm.     Pulses: Normal pulses.     Heart sounds: Normal heart sounds. No murmur heard. Pulmonary:     Effort: Pulmonary effort is normal. No respiratory distress.     Breath sounds: Normal breath sounds. No wheezing, rhonchi or rales.  Musculoskeletal:     Cervical back: Neck supple.     Right lower leg: No edema.     Left lower leg: No edema.  Lymphadenopathy:     Cervical: No cervical adenopathy.  Skin:    General: Skin is warm and dry.     Findings: No rash.  Neurological:     Mental Status: She is alert and oriented to person, place, and time. Mental status is  at baseline.  Psychiatric:        Mood and Affect: Mood normal.        Behavior: Behavior normal.      No results found for any visits on 09/11/23.      Assessment and Plan             No orders of the defined types were placed in this encounter.   No follow-ups on file.   The patient was advised to call back or seek an in-person evaluation if the symptoms worsen or if the condition fails to improve as anticipated.  I discussed the assessment and treatment plan with the patient. The patient was provided an opportunity to ask questions and all were answered. The patient agreed with the plan and demonstrated an understanding of the instructions.  I, Debera Lat, PA-C have reviewed all documentation for this visit. The documentation on  09/11/2023  for the exam, diagnosis, procedures, and orders are all accurate and complete.  Debera Lat, [facility], MMS [facility] [phone] (phone) [phone] (fax)  [facility]
